# Patient Record
Sex: Female | Born: 2000
Health system: Southern US, Community
[De-identification: ages and names within clinical notes are randomized; demographics above are authoritative.]

---
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
## 2021-04-17 ENCOUNTER — Telehealth: Payer: Self-pay

## 2021-04-17 NOTE — Telephone Encounter (Signed)
TC From mom she needs for all other pharmacies to be removed to avoid confusion. The pharmacy she will use is [facility] [address]

## 2021-06-03 ENCOUNTER — Encounter: Payer: Self-pay | Admitting: Pediatrics

## 2021-07-11 ENCOUNTER — Other Ambulatory Visit: Payer: Self-pay | Admitting: Pediatrics

## 2021-07-11 DIAGNOSIS — Z30011 Encounter for initial prescription of contraceptive pills: Secondary | ICD-10-CM

## 2021-07-12 NOTE — Telephone Encounter (Signed)
Needs a refill

## 2021-09-05 DIAGNOSIS — J069 Acute upper respiratory infection, unspecified: Secondary | ICD-10-CM | POA: Diagnosis not present

## 2021-09-05 DIAGNOSIS — J020 Streptococcal pharyngitis: Secondary | ICD-10-CM | POA: Diagnosis not present

## 2021-09-06 ENCOUNTER — Ambulatory Visit: Payer: Self-pay | Admitting: Pediatrics

## 2022-03-27 ENCOUNTER — Encounter: Payer: Self-pay | Admitting: *Deleted

## 2023-08-06 ENCOUNTER — Encounter: Payer: Self-pay | Admitting: *Deleted

## 2024-08-12 ENCOUNTER — Encounter: Payer: Self-pay | Admitting: *Deleted
